# Patient Record
Sex: Female | Born: 1990 | Race: Black or African American | Hispanic: No | Marital: Single | Smoking: Never smoker
Health system: Southern US, Community
[De-identification: ages and names within clinical notes are randomized; demographics above are authoritative.]

## PROBLEM LIST (undated history)

## (undated) DIAGNOSIS — Z8719 Personal history of other diseases of the digestive system: Secondary | ICD-10-CM

## (undated) DIAGNOSIS — J45909 Unspecified asthma, uncomplicated: Secondary | ICD-10-CM

## (undated) DIAGNOSIS — Z8711 Personal history of peptic ulcer disease: Secondary | ICD-10-CM

## (undated) HISTORY — PX: TONSILLECTOMY: SUR1361

---
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
## 2012-08-14 ENCOUNTER — Encounter (HOSPITAL_BASED_OUTPATIENT_CLINIC_OR_DEPARTMENT_OTHER): Payer: Self-pay | Admitting: *Deleted

## 2012-08-14 ENCOUNTER — Emergency Department (HOSPITAL_BASED_OUTPATIENT_CLINIC_OR_DEPARTMENT_OTHER)
Admission: EM | Admit: 2012-08-14 | Discharge: 2012-08-15 | Disposition: A | Discharge status: Home / Self Care | Payer: Medicaid Other | Attending: Emergency Medicine | Admitting: Emergency Medicine

## 2012-08-14 DIAGNOSIS — R0982 Postnasal drip: Secondary | ICD-10-CM | POA: Insufficient documentation

## 2012-08-14 DIAGNOSIS — J45909 Unspecified asthma, uncomplicated: Secondary | ICD-10-CM | POA: Insufficient documentation

## 2012-08-14 DIAGNOSIS — R05 Cough: Secondary | ICD-10-CM | POA: Insufficient documentation

## 2012-08-14 DIAGNOSIS — J309 Allergic rhinitis, unspecified: Secondary | ICD-10-CM | POA: Insufficient documentation

## 2012-08-14 DIAGNOSIS — Z9089 Acquired absence of other organs: Secondary | ICD-10-CM | POA: Insufficient documentation

## 2012-08-14 DIAGNOSIS — J069 Acute upper respiratory infection, unspecified: Secondary | ICD-10-CM | POA: Insufficient documentation

## 2012-08-14 DIAGNOSIS — H9209 Otalgia, unspecified ear: Secondary | ICD-10-CM | POA: Insufficient documentation

## 2012-08-14 DIAGNOSIS — R059 Cough, unspecified: Secondary | ICD-10-CM | POA: Insufficient documentation

## 2012-08-14 HISTORY — DX: Unspecified asthma, uncomplicated: J45.909

## 2012-08-14 NOTE — ED Notes (Signed)
Pt c/o URI symptoms x 3 days 

## 2012-08-15 ENCOUNTER — Emergency Department (HOSPITAL_BASED_OUTPATIENT_CLINIC_OR_DEPARTMENT_OTHER): Payer: Medicaid Other

## 2012-08-15 MED ORDER — GUAIFENESIN 100 MG/5ML PO LIQD: 100.0000 mg | ORAL | Status: DC | PRN

## 2012-08-15 MED ORDER — FLUTICASONE PROPIONATE 50 MCG/ACT NA SUSP: 2.0000 | Freq: Every day | NASAL | Status: DC

## 2012-08-15 MED ORDER — IBUPROFEN 600 MG PO TABS: 600.0000 mg | ORAL_TABLET | Freq: Four times a day (QID) | ORAL | Status: DC | PRN

## 2012-08-15 NOTE — ED Provider Notes (Signed)
History     CSN: [id number]  Arrival date & time 08/14/12  2211   First MD Initiated Contact with Patient 08/15/12 0009      Chief Complaint  Patient presents with  . URI    (Consider location/radiation/quality/duration/timing/severity/associated sxs/prior treatment) Patient is a 22 y.o. female presenting with URI. The history is provided by the patient. No language interpreter was used.  URI Presenting symptoms: congestion, cough, ear pain and rhinorrhea   Presenting symptoms: no fever   Congestion:    Location:  Nasal   Interferes with eating/drinking: no   Cough:    Cough characteristics:  Non-productive   Severity:  Moderate   Onset quality:  Gradual   Duration:  3 days   Timing:  Intermittent   Progression:  Unchanged   Chronicity:  New Rhinorrhea:    Quality:  Green   Severity:  Moderate   Duration:  3 days   Timing:  Intermittent   Progression:  Unchanged Severity:  Moderate Onset quality:  Gradual Duration:  3 days Timing:  Intermittent Progression:  Unchanged Chronicity:  New Relieved by:  Nothing Worsened by:  Nothing tried Ineffective treatments:  None tried Associated symptoms: no neck pain and no swollen glands   Risk factors: not elderly     Past Medical History  Diagnosis Date  . Asthma     Past Surgical History  Procedure Laterality Date  . Tonsillectomy      History reviewed. No pertinent family history.  History  Substance Use Topics  . Smoking status: Never Smoker   . Smokeless tobacco: Not on file  . Alcohol Use: No    OB History   Grav Para Term Preterm Abortions TAB SAB Ect Mult Living                  Review of Systems  Constitutional: Negative for fever.  HENT: Positive for ear pain, congestion and rhinorrhea. Negative for neck pain.   Respiratory: Positive for cough.   All other systems reviewed and are negative.    Allergies  Review of patient's allergies indicates no known allergies.  Home Medications  No  current outpatient prescriptions on file.  BP 123/78  Temp(Src) 98.9 F (37.2 C) (Oral)  Resp 16  Ht 5\' 3"  (1.6 m)  Wt 226 lb (102.513 kg)  BMI 40.04 kg/m2  SpO2 99%  Physical Exam  Constitutional: She is oriented to person, place, and time. She appears well-developed and well-nourished. No distress.  HENT:  Head: Normocephalic and atraumatic.  Mouth/Throat: Oropharynx is clear and moist.  Clear colorless post nasal drainage  Eyes: Conjunctivae are normal. Pupils are equal, round, and reactive to light.  Neck: Normal range of motion. Neck supple.  Cardiovascular: Normal rate, regular rhythm and intact distal pulses.   Pulmonary/Chest: Effort normal and breath sounds normal. No stridor. She has no wheezes. She has no rales.  Abdominal: Soft. Bowel sounds are normal. There is no tenderness. There is no rebound and no guarding.  Musculoskeletal: Normal range of motion.  Lymphadenopathy:    She has no cervical adenopathy.  Neurological: She is alert and oriented to person, place, and time.  Skin: Skin is warm and dry.  Psychiatric: She has a normal mood and affect.    ED Course  Procedures (including critical care time)  Labs Reviewed - No data to display Dg Chest 2 View  08/15/2012   *RADIOLOGY REPORT*  Clinical Data: upper respiratory tract infection  CHEST - 2 VIEW  Comparison: None  Findings: The heart size and mediastinal contours are within normal limits.  Both lungs are clear.  The visualized skeletal structures are unremarkable.  IMPRESSION: Negative exam.   Original Report Authenticated By: Signa Kell, M.D.     No diagnosis found.    MDM  Allergic rhinitis post nasal drip will treat for same        Kamika Goodloe Smitty Cords, MD 08/15/12 [phone]

## 2012-08-15 NOTE — ED Notes (Signed)
MD at bedside. 

## 2016-04-04 ENCOUNTER — Encounter (HOSPITAL_COMMUNITY): Payer: Self-pay | Admitting: Nurse Practitioner

## 2016-04-04 ENCOUNTER — Emergency Department (HOSPITAL_COMMUNITY)
Admission: EM | Admit: 2016-04-04 | Discharge: 2016-04-04 | Disposition: A | Discharge status: Home / Self Care | Payer: Medicaid Other | Attending: Emergency Medicine | Admitting: Emergency Medicine

## 2016-04-04 DIAGNOSIS — K047 Periapical abscess without sinus: Secondary | ICD-10-CM | POA: Insufficient documentation

## 2016-04-04 DIAGNOSIS — Y929 Unspecified place or not applicable: Secondary | ICD-10-CM | POA: Insufficient documentation

## 2016-04-04 DIAGNOSIS — J45909 Unspecified asthma, uncomplicated: Secondary | ICD-10-CM | POA: Insufficient documentation

## 2016-04-04 DIAGNOSIS — Y9389 Activity, other specified: Secondary | ICD-10-CM | POA: Insufficient documentation

## 2016-04-04 DIAGNOSIS — X58XXXA Exposure to other specified factors, initial encounter: Secondary | ICD-10-CM | POA: Insufficient documentation

## 2016-04-04 DIAGNOSIS — Z79899 Other long term (current) drug therapy: Secondary | ICD-10-CM | POA: Insufficient documentation

## 2016-04-04 DIAGNOSIS — S025XXA Fracture of tooth (traumatic), initial encounter for closed fracture: Secondary | ICD-10-CM | POA: Insufficient documentation

## 2016-04-04 DIAGNOSIS — Y999 Unspecified external cause status: Secondary | ICD-10-CM | POA: Insufficient documentation

## 2016-04-04 MED ORDER — NAPROXEN 500 MG PO TABS: 500.0000 mg | ORAL_TABLET | Freq: Two times a day (BID) | ORAL | 0 refills | Status: DC

## 2016-04-04 MED ORDER — AMOXICILLIN 500 MG PO CAPS: 500.0000 mg | ORAL_CAPSULE | Freq: Three times a day (TID) | ORAL | 0 refills | Status: DC

## 2016-04-04 NOTE — ED Provider Notes (Signed)
[facility]-EMERGENCY DEPT Provider Note   CSN: [id number] Arrival date & time: 04/04/16  1828  By signing my name below, I, Rosario AdieWilliam Andrew Hiatt, attest that this documentation has been prepared under the direction and in the presence of [facility] Neese, [place].  Electronically Signed: Rosario AdieWilliam Andrew Hiatt, ED Scribe. 04/04/16. 6:49 PM.  History   Chief Complaint Chief Complaint  Patient presents with  . Dental Pain   The history is provided by the patient. No language interpreter was used.  Dental Pain   This is a new problem. The current episode started yesterday. The problem occurs constantly. The problem has been gradually worsening. The pain is at a severity of 7/10. Treatments tried: Ibuprofen. The treatment provided no relief.    HPI Comments: Susan SpatesCarrie Petitti is a 26 y.o. female with a h/o asthma, who presents to the Emergency Department complaining of persistent, lower, left-sided dental pain beginning yesterday, worsening since this morning. Pt describes her pain as throbbing. Pt notes that last week she was eating a hard food, and she fractured a tooth to her area of pain, but was not in pain at that time. Her pain to the area is exacerbated with exposure to cold air. Pt has been taking Ibuprofen at home without relief of her pain. She is not currently followed by a dental specialist. Pt denies fever, nausea, vomiting, abdominal pain, neck pain, ear pain, or any other associated symptoms.   Past Medical History:  Diagnosis Date  . Asthma    There are no active problems to display for this patient.  Past Surgical History:  Procedure Laterality Date  . TONSILLECTOMY     OB History    No data available     Home Medications    Prior to Admission medications   Medication Sig Start Date End Date Taking? Authorizing Provider  amoxicillin (AMOXIL) 500 MG capsule Take 1 capsule (500 mg total) by mouth 3 (three) times daily. 04/04/16   Hope Orlene OchM Neese, NP  fluticasone (FLONASE) 50 MCG/ACT nasal  spray Place 2 sprays into the nose daily. 08/15/12   April Palumbo, MD  guaiFENesin (ROBITUSSIN) 100 MG/5ML liquid Take 5-10 mLs (100-200 mg total) by mouth every 4 (four) hours as needed for cough. 08/15/12   April Palumbo, MD  ibuprofen (ADVIL,MOTRIN) 600 MG tablet Take 1 tablet (600 mg total) by mouth every 6 (six) hours as needed for pain. 08/15/12   April Palumbo, MD  naproxen (NAPROSYN) 500 MG tablet Take 1 tablet (500 mg total) by mouth 2 (two) times daily. 04/04/16   Hope Orlene OchM Neese, NP   Family History History reviewed. No pertinent family history.  Social History Social History  Substance Use Topics  . Smoking status: Never Smoker  . Smokeless tobacco: Never Used  . Alcohol use Yes   Allergies   Patient has no known allergies.  Review of Systems Review of Systems  Constitutional: Negative for fever.  HENT: Positive for dental problem. Negative for ear pain.   Gastrointestinal: Negative for abdominal pain, nausea and vomiting.  Musculoskeletal: Negative for neck pain.  Hematological: Positive for adenopathy.  All other systems reviewed and are negative.  Physical Exam Updated Vital Signs BP 125/97   Pulse 68   Temp 98.5 F (36.9 C) (Oral)   Resp 17   SpO2 100%   Physical Exam  Constitutional: She appears well-developed and well-nourished. No distress.  HENT:  Head: Normocephalic and atraumatic.  Right Ear: Tympanic membrane and external ear normal.  Left Ear: Tympanic  membrane and external ear normal.  Nose: Nose normal.  Mouth/Throat: Uvula is midline, oropharynx is clear and moist and mucous membranes are normal. No trismus in the jaw. No uvula swelling. No oropharyngeal exudate, posterior oropharyngeal edema, posterior oropharyngeal erythema or tonsillar abscesses. No tonsillar exudate.  Lower, frontal left tooth is a fractured with gingival erythema and swelling to the surrounding area.   Eyes: Conjunctivae and EOM are normal. Pupils are equal, round, and reactive to  light. No scleral icterus.  Neck: Normal range of motion.  Cardiovascular: Normal rate, regular rhythm and normal heart sounds.   No murmur heard. Pulmonary/Chest: Effort normal and breath sounds normal. No respiratory distress. She has no wheezes. She has no rales.  Abdominal: Soft. Bowel sounds are normal. She exhibits no distension. There is no tenderness.  Musculoskeletal: Normal range of motion.  Lymphadenopathy:    She has cervical adenopathy (anterior).  Neurological: She is alert.  Skin: No pallor.  Psychiatric: She has a normal mood and affect. Her behavior is normal.  Nursing note and vitals reviewed.  ED Treatments / Results  DIAGNOSTIC STUDIES: Oxygen Saturation is 100% on RA, normal by my interpretation.   COORDINATION OF CARE: 6:49 PM-Discussed next steps with pt. Pt verbalized understanding and is agreeable with the plan.   Labs (all labs ordered are listed, but only abnormal results are displayed) Labs Reviewed - No data to display  Radiology No results found.  Procedures Procedures   Medications Ordered in ED Medications - No data to display  Initial Impression / Assessment and Plan / ED Course  I have reviewed the triage vital signs and the nursing notes.  Clinical Course    Dental pain associated with dental fracture no signs or symptoms of dental abscess with patient afebrile, non toxic appearing and swallowing secretions well. Exam unconcerning for Ludwig's angina or other deep tissue infection in neck. As there is gum swelling and erythema, will treat with antibiotic and pain medicine. Urged patient to follow-up with dentist. I gave patient referral to dentist and stressed the importance of dental follow up for ultimate management of dental pain. Patient voices understanding and is agreeable to plan.  Final Clinical Impressions(s) / ED Diagnoses   Final diagnoses:  Closed fracture of tooth, initial encounter  Dental infection   New  Prescriptions New Prescriptions   AMOXICILLIN (AMOXIL) 500 MG CAPSULE    Take 1 capsule (500 mg total) by mouth 3 (three) times daily.   NAPROXEN (NAPROSYN) 500 MG TABLET    Take 1 tablet (500 mg total) by mouth 2 (two) times daily.   I personally performed the services described in this documentation, which was scribed in my presence. The recorded information has been reviewed and is accurate.     Susan Harbor, NP 04/04/16 1855    Susan Spates, MD 04/06/16 [phone]

## 2016-04-04 NOTE — ED Triage Notes (Signed)
Pt presents with c/o dental pain. The pain began last night. The pain is in the tooth that she chipped while eating last week. She took tylenol with some relief of the pain. She is also requesting a work note because she missed work today from the pain

## 2016-09-26 ENCOUNTER — Emergency Department (HOSPITAL_COMMUNITY)
Admission: EM | Admit: 2016-09-26 | Discharge: 2016-09-26 | Disposition: A | Discharge status: Home / Self Care | Payer: Self-pay | Attending: Emergency Medicine | Admitting: Emergency Medicine

## 2016-09-26 ENCOUNTER — Encounter (HOSPITAL_COMMUNITY): Payer: Self-pay | Admitting: *Deleted

## 2016-09-26 ENCOUNTER — Emergency Department (HOSPITAL_COMMUNITY): Payer: Self-pay

## 2016-09-26 DIAGNOSIS — Z79899 Other long term (current) drug therapy: Secondary | ICD-10-CM | POA: Insufficient documentation

## 2016-09-26 DIAGNOSIS — R197 Diarrhea, unspecified: Secondary | ICD-10-CM | POA: Insufficient documentation

## 2016-09-26 DIAGNOSIS — R112 Nausea with vomiting, unspecified: Secondary | ICD-10-CM | POA: Insufficient documentation

## 2016-09-26 DIAGNOSIS — J45909 Unspecified asthma, uncomplicated: Secondary | ICD-10-CM | POA: Insufficient documentation

## 2016-09-26 DIAGNOSIS — R1013 Epigastric pain: Secondary | ICD-10-CM | POA: Insufficient documentation

## 2016-09-26 LAB — COMPREHENSIVE METABOLIC PANEL
ALK PHOS: 45 U/L (ref 38–126)
ALT: 13 U/L — AB (ref 14–54)
ANION GAP: 8 (ref 5–15)
AST: 24 U/L (ref 15–41)
Albumin: 4.3 g/dL (ref 3.5–5.0)
BUN: 11 mg/dL (ref 6–20)
CALCIUM: 9 mg/dL (ref 8.9–10.3)
CO2: 22 mmol/L (ref 22–32)
CREATININE: 0.58 mg/dL (ref 0.44–1.00)
Chloride: 107 mmol/L (ref 101–111)
GFR calc non Af Amer: >60 mL/min (ref >60)
Glucose, Bld: 91 mg/dL (ref 65–99)
Potassium: 4 mmol/L (ref 3.5–5.1)
Sodium: 137 mmol/L (ref 135–145)
TOTAL PROTEIN: 7.1 g/dL (ref 6.5–8.1)
Total Bilirubin: 0.5 mg/dL (ref 0.3–1.2)

## 2016-09-26 LAB — URINALYSIS, ROUTINE W REFLEX MICROSCOPIC
BILIRUBIN URINE: NEGATIVE
Glucose, UA: NEGATIVE mg/dL
HGB URINE DIPSTICK: NEGATIVE
KETONES UR: NEGATIVE mg/dL
Leukocytes, UA: NEGATIVE
NITRITE: NEGATIVE
Protein, ur: NEGATIVE mg/dL
SPECIFIC GRAVITY, URINE: 1.008 (ref 1.005–1.030)
pH: 5 (ref 5.0–8.0)

## 2016-09-26 LAB — CBC WITH DIFFERENTIAL/PLATELET
BASOS ABS: 0 10*3/uL (ref 0.0–0.1)
BASOS PCT: 0 %
EOS ABS: 0.1 10*3/uL (ref 0.0–0.7)
Eosinophils Relative: 2 %
HEMATOCRIT: 34.6 % — AB (ref 36.0–46.0)
Hemoglobin: 12.3 g/dL (ref 12.0–15.0)
Lymphocytes Relative: 48 %
Lymphs Abs: 1.6 10*3/uL (ref 0.7–4.0)
MCH: 29.3 pg (ref 26.0–34.0)
MCHC: 35.5 g/dL (ref 30.0–36.0)
MCV: 82.4 fL (ref 78.0–100.0)
MONO ABS: 0.4 10*3/uL (ref 0.1–1.0)
MONOS PCT: 12 %
NEUTROS ABS: 1.3 10*3/uL — AB (ref 1.7–7.7)
NEUTROS PCT: 38 %
Platelets: 283 10*3/uL (ref 150–400)
RBC: 4.2 MIL/uL (ref 3.87–5.11)
RDW: 13.3 % (ref 11.5–15.5)
WBC: 3.4 10*3/uL — ABNORMAL LOW (ref 4.0–10.5)

## 2016-09-26 LAB — POC URINE PREG, ED: PREG TEST UR: NEGATIVE

## 2016-09-26 LAB — LIPASE, BLOOD: LIPASE: 27 U/L (ref 11–51)

## 2016-09-26 MED ORDER — PROMETHAZINE HCL 25 MG/ML IJ SOLN
12.5000 mg | Freq: Once | INTRAMUSCULAR | Status: AC
  Administered 2016-09-26: 12.5 mg via INTRAVENOUS
  Filled 2016-09-26: qty 1

## 2016-09-26 MED ORDER — SODIUM CHLORIDE 0.9 % IV BOLUS (SEPSIS)
1000.0000 mL | Freq: Once | INTRAVENOUS | Status: AC
  Administered 2016-09-26: 1000 mL via INTRAVENOUS

## 2016-09-26 MED ORDER — SUCRALFATE 1 GM/10ML PO SUSP: 1.0000 g | Freq: Three times a day (TID) | ORAL | 0 refills | Status: DC

## 2016-09-26 MED ORDER — KETOROLAC TROMETHAMINE 30 MG/ML IJ SOLN
15.0000 mg | Freq: Once | INTRAMUSCULAR | Status: AC
  Administered 2016-09-26: 15 mg via INTRAVENOUS
  Filled 2016-09-26: qty 1

## 2016-09-26 MED ORDER — DICYCLOMINE HCL 10 MG/ML IM SOLN
20.0000 mg | Freq: Once | INTRAMUSCULAR | Status: AC
  Administered 2016-09-26: 20 mg via INTRAMUSCULAR
  Filled 2016-09-26: qty 2

## 2016-09-26 MED ORDER — ONDANSETRON HCL 4 MG/2ML IJ SOLN
4.0000 mg | Freq: Once | INTRAMUSCULAR | Status: AC
  Administered 2016-09-26: 4 mg via INTRAVENOUS
  Filled 2016-09-26: qty 2

## 2016-09-26 MED ORDER — GI COCKTAIL ~~LOC~~
30.0000 mL | Freq: Once | ORAL | Status: AC
  Administered 2016-09-26: 30 mL via ORAL
  Filled 2016-09-26: qty 30

## 2016-09-26 MED ORDER — ONDANSETRON 8 MG PO TBDP: 8.0000 mg | ORAL_TABLET | Freq: Three times a day (TID) | ORAL | 0 refills | Status: DC | PRN

## 2016-09-26 MED ORDER — OMEPRAZOLE 20 MG PO CPDR: 20.0000 mg | DELAYED_RELEASE_CAPSULE | Freq: Every day | ORAL | 0 refills | Status: DC

## 2016-09-26 MED ORDER — DICYCLOMINE HCL 20 MG PO TABS: 20.0000 mg | ORAL_TABLET | Freq: Two times a day (BID) | ORAL | 0 refills | Status: DC

## 2016-09-26 NOTE — ED Notes (Signed)
US at bedside

## 2016-09-26 NOTE — ED Triage Notes (Signed)
Patient is alert and oriented x4.  She is complaining of abdominal pain with nausea, vomiting and diarrhea that started Tuesday.  Patient states that on Monday night she went out drinking and the vomiting started on Tuesday.  Currently she rates her pain 8 of 10

## 2016-09-26 NOTE — ED Provider Notes (Signed)
[facility]-EMERGENCY DEPT Provider Note   CSN: [id number] Arrival date & time: 09/26/16  [id number]     History   Chief Complaint Chief Complaint  Patient presents with  . Emesis    HPI Susan Lynch is a 26 y.o. female.  HPI 26 year old female with no significant past medical history presents to the ER today with complaints of nausea, headache, diarrhea, epigastric abdominal pain. Patient states that she drank a significant amount of alcohol approximately 4 days ago. States that on Monday she had a significant hangover where she had one episode of vomiting. Went to work and felt okay after that. He states on Tuesday she started developing profuse watery diarrhea and nonbloody nonbilious emesis. States that her by mouth intake is intermittent. She is able to keep some things down both thinks he throws up. She also complains of epigastric abdominal pain. States that her diarrhea has persisted. She was concerned since her symptoms are not improving. She does have a history of gastritis and viral enteritis. States that this felt like symptoms were due to food poisoning initially. She denies any melena or hematochezia. Denies any abdominal surgeries. Last bowel movement was prior to arrival. She denies recent antibiotic use, recent travel outside of the [place], new foods. She denies any fevers, headaches, vision changes, lightheadedness, dizziness, chest pain, shortness breath, urinary symptoms, vaginal symptoms, paresthesias. Patient has not tried any further symptoms at home. Makes better or worse.  Past Medical History:  Diagnosis Date  . Asthma     There are no active problems to display for this patient.   Past Surgical History:  Procedure Laterality Date  . TONSILLECTOMY      OB History    No data available       Home Medications    Prior to Admission medications   Medication Sig Start Date End Date Taking? Authorizing Provider  amoxicillin (AMOXIL) 500 MG capsule Take 1 capsule (500 mg  total) by mouth 3 (three) times daily. 04/04/16   Janne NapoleonNeese, Hope M, NP  fluticasone (FLONASE) 50 MCG/ACT nasal spray Place 2 sprays into the nose daily. 08/15/12   Palumbo, April, MD  guaiFENesin (ROBITUSSIN) 100 MG/5ML liquid Take 5-10 mLs (100-200 mg total) by mouth every 4 (four) hours as needed for cough. 08/15/12   Palumbo, April, MD  ibuprofen (ADVIL,MOTRIN) 600 MG tablet Take 1 tablet (600 mg total) by mouth every 6 (six) hours as needed for pain. 08/15/12   Palumbo, April, MD  naproxen (NAPROSYN) 500 MG tablet Take 1 tablet (500 mg total) by mouth 2 (two) times daily. 04/04/16   Janne NapoleonNeese, Hope M, NP    Family History No family history on file.  Social History Social History  Substance Use Topics  . Smoking status: Never Smoker  . Smokeless tobacco: Never Used  . Alcohol use Yes     Allergies   Patient has no known allergies.   Review of Systems Review of Systems  Constitutional: Negative for chills and fever.  HENT: Negative for congestion.   Eyes: Negative for visual disturbance.  Respiratory: Negative for cough and shortness of breath.   Cardiovascular: Negative for chest pain.  Gastrointestinal: Positive for abdominal pain (epigastric), diarrhea, nausea and vomiting. Negative for blood in stool.  Genitourinary: Negative for dysuria, flank pain, frequency, hematuria, urgency, vaginal bleeding and vaginal discharge.  Musculoskeletal: Negative for arthralgias and myalgias.  Skin: Negative for rash.  Neurological: Negative for dizziness, syncope, weakness, light-headedness, numbness and headaches.  Psychiatric/Behavioral: Negative for sleep disturbance. The  patient is not nervous/anxious.      Physical Exam Updated Vital Signs BP 127/73   Pulse 63   Temp 98.3 F (36.8 C) (Oral)   Resp 16   Ht 5\' 2"  (1.575 m)   Wt 81.6 kg (180 lb)   LMP 09/08/2016   SpO2 100%   BMI 32.92 kg/m   Physical Exam  Constitutional: She is oriented to person, place, and time. She appears  well-developed and well-nourished.  Non-toxic appearance. No distress.  HENT:  Head: Normocephalic and atraumatic.  Nose: Nose normal.  Mouth/Throat: Oropharynx is clear and moist.  Mucous membranes appear moist.  Eyes: Conjunctivae and EOM are normal. Pupils are equal, round, and reactive to light. Right eye exhibits no discharge. Left eye exhibits no discharge.  Neck: Normal range of motion. Neck supple.  Cardiovascular: Normal rate, regular rhythm, normal heart sounds and intact distal pulses.   Pulmonary/Chest: Effort normal and breath sounds normal. No respiratory distress. She exhibits no tenderness.  Abdominal: Soft. Bowel sounds are normal. There is tenderness in the right upper quadrant and epigastric area. There is no rigidity, no rebound, no guarding and no CVA tenderness.  Musculoskeletal: Normal range of motion. She exhibits no tenderness.  Lymphadenopathy:    She has no cervical adenopathy.  Neurological: She is alert and oriented to person, place, and time.  Skin: Skin is warm and dry. Capillary refill takes less than 2 seconds.  Good skin turgor.  Psychiatric: Her behavior is normal. Judgment and thought content normal.  Nursing note and vitals reviewed.    ED Treatments / Results  Labs (all labs ordered are listed, but only abnormal results are displayed) Labs Reviewed  COMPREHENSIVE METABOLIC PANEL - Abnormal; Notable for the following:       Result Value   ALT 13 (*)    All other components within normal limits  CBC WITH DIFFERENTIAL/PLATELET - Abnormal; Notable for the following:    WBC 3.4 (*)    HCT 34.6 (*)    Neutro Abs 1.3 (*)    All other components within normal limits  URINALYSIS, ROUTINE W REFLEX MICROSCOPIC - Abnormal; Notable for the following:    Color, Urine AMBER (*)    All other components within normal limits  LIPASE, BLOOD  POC URINE PREG, ED    EKG  EKG Interpretation None       Radiology No results  found.  Procedures Procedures (including critical care time)  Medications Ordered in ED Medications  sodium chloride 0.9 % bolus 1,000 mL (0 mLs Intravenous Stopped 09/26/16 1234)  ondansetron (ZOFRAN) injection 4 mg (4 mg Intravenous Given 09/26/16 1035)  gi cocktail (Maalox,Lidocaine,Donnatal) (30 mLs Oral Given 09/26/16 1034)  dicyclomine (BENTYL) injection 20 mg (20 mg Intramuscular Given 09/26/16 1035)  promethazine (PHENERGAN) injection 12.5 mg (12.5 mg Intravenous Given 09/26/16 1316)  ketorolac (TORADOL) 30 MG/ML injection 15 mg (15 mg Intravenous Given 09/26/16 1316)     Initial Impression / Assessment and Plan / ED Course  I have reviewed the triage vital signs and the nursing notes.  Pertinent labs & imaging results that were available during my care of the patient were reviewed by me and considered in my medical decision making (see chart for details).     Patient with symptoms consistent with viral gastroenteritis.  Vitals are stable, no fever.  No signs of dehydration, tolerating PO fluids > 6 oz.  Lungs are clear.  No focal abdominal pain, no concern for appendicitis, cholecystitis, pancreatitis, ruptured viscus,  UTI, kidney stone, or any other abdominal etiology.  Supportive therapy indicated with return if symptoms worsen.  Patient counseled.   Final Clinical Impressions(s) / ED Diagnoses   Final diagnoses:  Nausea vomiting and diarrhea    New Prescriptions Discharge Medication List as of 09/26/2016 12:41 PM    START taking these medications   Details  dicyclomine (BENTYL) 20 MG tablet Take 1 tablet (20 mg total) by mouth 2 (two) times daily., Starting Thu 09/26/2016, Print    omeprazole (PRILOSEC) 20 MG capsule Take 1 capsule (20 mg total) by mouth daily., Starting Thu 09/26/2016, Print    ondansetron (ZOFRAN-ODT) 8 MG disintegrating tablet Take 1 tablet (8 mg total) by mouth every 8 (eight) hours as needed for nausea., Starting Thu 09/26/2016, Print    sucralfate  (CARAFATE) 1 GM/10ML suspension Take 10 mLs (1 g total) by mouth 4 (four) times daily -  with meals and at bedtime., Starting Thu 09/26/2016, Print         Rise Mu, PA-C 09/30/16 1610    Little, Ambrose Finland, MD 09/30/16 1500

## 2016-09-26 NOTE — ED Notes (Signed)
Patient tolerating PO fluids 

## 2016-09-26 NOTE — Discharge Instructions (Signed)
Your abdominal pain is likely from gastritis, reflux or a stomach ulcer. You will need to take the prescribed proton pump inhibitor as directed, and avoid spicy/fatty/acidic foods. Avoid laying down flat within 30 minutes of eating. Avoid NSAIDs like ibuprofen or Aleve on an empty stomach. You may also take the bentyl for abd cramps. Take the Prilosec daily for acid reducer, and use Carafate before meals. Use zofran as needed for nausea. Follow up with the gastroenterologist (GI doctor) listed for ongoing evaluation of your abdominal pain. Return to the ER for new or worsening symptoms, any additional concers.   SEEK IMMEDIATE MEDICAL ATTENTION IF YOU DEVELOP ANY OF THE FOLLOWING SYMPTOMS: The pain does not go away or becomes severe.  A temperature above 101 develops.  Repeated vomiting occurs (multiple episodes).  Blood is being passed in stools or vomit (bright red or black tarry stools).  Return also if you develop chest pain, difficulty breathing, dizziness or fainting

## 2017-02-25 ENCOUNTER — Encounter (HOSPITAL_BASED_OUTPATIENT_CLINIC_OR_DEPARTMENT_OTHER): Payer: Self-pay

## 2017-02-25 ENCOUNTER — Emergency Department (HOSPITAL_BASED_OUTPATIENT_CLINIC_OR_DEPARTMENT_OTHER)
Admission: EM | Admit: 2017-02-25 | Discharge: 2017-02-26 | Disposition: A | Discharge status: Home / Self Care | Payer: Self-pay | Attending: Emergency Medicine | Admitting: Emergency Medicine

## 2017-02-25 ENCOUNTER — Other Ambulatory Visit: Payer: Self-pay

## 2017-02-25 DIAGNOSIS — J45909 Unspecified asthma, uncomplicated: Secondary | ICD-10-CM | POA: Insufficient documentation

## 2017-02-25 DIAGNOSIS — K2920 Alcoholic gastritis without bleeding: Secondary | ICD-10-CM | POA: Insufficient documentation

## 2017-02-25 HISTORY — DX: Personal history of other diseases of the digestive system: Z87.19

## 2017-02-25 HISTORY — DX: Personal history of peptic ulcer disease: Z87.11

## 2017-02-25 LAB — URINALYSIS, ROUTINE W REFLEX MICROSCOPIC
Bilirubin Urine: NEGATIVE
Glucose, UA: NEGATIVE mg/dL
Hgb urine dipstick: NEGATIVE
KETONES UR: NEGATIVE mg/dL
LEUKOCYTES UA: NEGATIVE
NITRITE: NEGATIVE
PH: 6 (ref 5.0–8.0)
Protein, ur: NEGATIVE mg/dL
SPECIFIC GRAVITY, URINE: 1.02 (ref 1.005–1.030)

## 2017-02-25 LAB — PREGNANCY, URINE: Preg Test, Ur: NEGATIVE

## 2017-02-25 NOTE — ED Triage Notes (Signed)
C/o abd pain, n/v started yesterday-states she LWBS [facility] ED just PTA-states they did draw blood work-NAD-steady gait

## 2017-02-26 MED ORDER — ONDANSETRON 8 MG PO TBDP: 8.0000 mg | ORAL_TABLET | Freq: Three times a day (TID) | ORAL | 1 refills | Status: AC | PRN

## 2017-02-26 MED ORDER — ONDANSETRON HCL 4 MG/2ML IJ SOLN
4.0000 mg | Freq: Once | INTRAMUSCULAR | Status: AC
  Administered 2017-02-26: 4 mg via INTRAVENOUS
  Filled 2017-02-26: qty 2

## 2017-02-26 MED ORDER — OMEPRAZOLE 20 MG PO CPDR: DELAYED_RELEASE_CAPSULE | ORAL | 0 refills | Status: AC

## 2017-02-26 MED ORDER — SUCRALFATE 1 GM/10ML PO SUSP
1.0000 g | Freq: Three times a day (TID) | ORAL | Status: DC
  Administered 2017-02-26: 1 g via ORAL
  Filled 2017-02-26: qty 10

## 2017-02-26 MED ORDER — SODIUM CHLORIDE 0.9 % IV BOLUS (SEPSIS)
1000.0000 mL | Freq: Once | INTRAVENOUS | Status: AC
  Administered 2017-02-26: 1000 mL via INTRAVENOUS

## 2017-02-26 MED ORDER — SUCRALFATE 1 G PO TABS: 1.0000 g | ORAL_TABLET | Freq: Three times a day (TID) | ORAL | 0 refills | Status: AC

## 2017-02-26 MED ORDER — PANTOPRAZOLE SODIUM 40 MG IV SOLR
40.0000 mg | Freq: Once | INTRAVENOUS | Status: AC
  Administered 2017-02-26: 40 mg via INTRAVENOUS
  Filled 2017-02-26: qty 40

## 2017-02-26 NOTE — ED Notes (Signed)
Pt discharged to home with family. NAD.  

## 2017-02-26 NOTE — ED Provider Notes (Signed)
[facility] Provider Note: Lowella DellJ. Lane Ara Mano, MD, FACEP  CSN: [id number] MRN: [id number] ARRIVAL: 02/25/17 at 2236 ROOM: [facility]01/[facility]   CHIEF COMPLAINT  Abdominal Pain   HISTORY OF PRESENT ILLNESS  02/26/17 12:11 AM Susan Lynch is a [age] y.o. female who was treated for gastritis in June of this year.  She states that episode was brought on by a bout of drinking.  She was treated with multiple medications including Carafate, a PPI, Bentyl and Zofran with improvement.  She is here with another similar episode that began 3 or 4 days ago, again after drinking.  She describes the pain as sharp and is located in the epigastrium.  She rates it as a 7 out of 10.  It has been associated with nausea and vomiting which persist.  She had some transient diarrhea that has resolved.  She states that any attempts to eat or drink cause her to vomit.  [facility] where lab work was done that was unremarkable, but she ultimately left without being seen.   Past Medical History:  Diagnosis Date  . Asthma   . History of stomach ulcers     Past Surgical History:  Procedure Laterality Date  . TONSILLECTOMY      No family history on file.  Social History   Tobacco Use  . Smoking status: Never Smoker  . Smokeless tobacco: Never Used  Substance Use Topics  . Alcohol use: Yes    Comment: occ  . Drug use: Yes    Types: Marijuana    Prior to Admission medications   Not on File    Allergies Apple and Cherry   REVIEW OF SYSTEMS  Negative except as noted here or in the History of Present Illness.   PHYSICAL EXAMINATION  Initial Vital Signs Blood pressure 128/75, pulse 68, temperature 98.4 F (36.9 C), temperature source Oral, resp. rate 18, last menstrual period 02/14/2017, SpO2 100 %.  Examination General: Well-developed, well-nourished female in no acute distress; appearance consistent with age of record HENT: normocephalic;  atraumatic Eyes: pupils equal, round and reactive to light; extraocular muscles intact Neck: supple Heart: regular rate and rhythm Lungs: clear to auscultation bilaterally Abdomen: soft; nondistended; epigastric tenderness; no masses or hepatosplenomegaly; bowel sounds hypoactive Extremities: No deformity; full range of motion; pulses normal Neurologic: Awake, alert and oriented; motor function intact in all extremities and symmetric; no facial droop Skin: Warm and dry Psychiatric: Normal mood and affect   RESULTS  Summary of this visit's results, reviewed by myself:   EKG Interpretation  Date/Time:    Ventricular Rate:    PR Interval:    QRS Duration:   QT Interval:    QTC Calculation:   R Axis:     Text Interpretation:        Laboratory Studies: Results for orders placed or performed during the hospital encounter of 02/25/17 (from the past 24 hour(s))  Pregnancy, urine     Status: None   Collection Time: 02/25/17 10:40 PM  Result Value Ref Range   Preg Test, Ur NEGATIVE NEGATIVE  Urinalysis, Routine w reflex microscopic     Status: Abnormal   Collection Time: 02/25/17 10:40 PM  Result Value Ref Range   Color, Urine YELLOW YELLOW   APPearance CLOUDY (A) CLEAR   Specific Gravity, Urine 1.020 1.005 - 1.030   pH 6.0 5.0 - 8.0   Glucose, UA NEGATIVE NEGATIVE mg/dL   Hgb urine dipstick NEGATIVE NEGATIVE  Bilirubin Urine NEGATIVE NEGATIVE   Ketones, ur NEGATIVE NEGATIVE mg/dL   Protein, ur NEGATIVE NEGATIVE mg/dL   Nitrite NEGATIVE NEGATIVE   Leukocytes, UA NEGATIVE NEGATIVE   Imaging Studies: No results found.  ED COURSE  Nursing notes and initial vitals signs, including pulse oximetry, reviewed.  Vitals:   02/26/17 0138  BP: 128/75  Pulse: 68  Resp: 18  Temp: 98.4 F (36.9 C)  TempSrc: Oral  SpO2: 100%   1:44 AM Pain improved after IV and oral medications.  We will treat for gastritis.  PROCEDURES    ED DIAGNOSES     ICD-10-CM   1. 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

## 2017-07-03 ENCOUNTER — Encounter (HOSPITAL_BASED_OUTPATIENT_CLINIC_OR_DEPARTMENT_OTHER): Payer: Self-pay | Admitting: *Deleted

## 2017-07-03 ENCOUNTER — Emergency Department (HOSPITAL_BASED_OUTPATIENT_CLINIC_OR_DEPARTMENT_OTHER)
Admission: EM | Admit: 2017-07-03 | Discharge: 2017-07-03 | Disposition: A | Discharge status: Home / Self Care | Payer: Self-pay | Attending: Emergency Medicine | Admitting: Emergency Medicine

## 2017-07-03 ENCOUNTER — Other Ambulatory Visit: Payer: Self-pay

## 2017-07-03 DIAGNOSIS — J069 Acute upper respiratory infection, unspecified: Secondary | ICD-10-CM | POA: Insufficient documentation

## 2017-07-03 DIAGNOSIS — J45909 Unspecified asthma, uncomplicated: Secondary | ICD-10-CM | POA: Insufficient documentation

## 2017-07-03 DIAGNOSIS — Z79899 Other long term (current) drug therapy: Secondary | ICD-10-CM | POA: Insufficient documentation

## 2017-07-03 DIAGNOSIS — B9789 Other viral agents as the cause of diseases classified elsewhere: Secondary | ICD-10-CM | POA: Insufficient documentation

## 2017-07-03 LAB — RAPID STREP SCREEN (MED CTR MEBANE ONLY): STREPTOCOCCUS, GROUP A SCREEN (DIRECT): NEGATIVE

## 2017-07-03 MED ORDER — BENZONATATE 100 MG PO CAPS: 100.0000 mg | ORAL_CAPSULE | Freq: Three times a day (TID) | ORAL | 0 refills | Status: AC | PRN

## 2017-07-03 MED ORDER — ACETAMINOPHEN ER 650 MG PO TBCR: 650.0000 mg | EXTENDED_RELEASE_TABLET | Freq: Three times a day (TID) | ORAL | 0 refills | Status: AC | PRN

## 2017-07-03 MED ORDER — FLUTICASONE PROPIONATE 50 MCG/ACT NA SUSP: 1.0000 | Freq: Every day | NASAL | 2 refills | Status: AC

## 2017-07-03 NOTE — Discharge Instructions (Signed)
You were seen in the emergency today and diagnosed with a viral illness. Your rapid strep test was negative, we will call you if the culture is positive. I have prescribed you multiple medications to treat your symptoms.   -Flonase to be used 1 spray in each nostril daily.  This medication is used to treat your congestion.  -Tessalon can be taken once every 8 hours as needed.  This medication is used to treat your cough.  -Tylenol to be taken once every 8 hours as needed for pain.   We have prescribed you new medication(s) today. Discuss the medications prescribed today with your pharmacist as they can have adverse effects and interactions with your other medicines including over the counter and prescribed medications. Seek medical evaluation if you start to experience new or abnormal symptoms after taking one of these medicines, seek care immediately if you start to experience difficulty breathing, feeling of your throat closing, facial swelling, or rash as these could be indications of a more serious allergic reaction   You will need to follow-up with your primary care provider in 5 days if your symptoms have not improved.  If you do not have a primary care provider one is provided in your discharge instructions.  Return to the emergency department for any new or worsening symptoms including but not limited to persistent fever (temp>100.4) not improved with Ibuprofen/tylenol, worsening difficulty breathing, chest pain, passing out, or any other concerns.

## 2017-07-03 NOTE — ED Provider Notes (Signed)
[facility] EMERGENCY DEPARTMENT Provider Note   CSN: [id number] Arrival date & time: 07/03/17  2126     History   Chief Complaint Chief Complaint  Patient presents with  . Sore Throat    HPI Susan Lynch is a 27 y.o. female with a hx of asthma and PUD who presents to the ED with URI sxs for the past 3 days. Patient states she is experiencing congestion, rhinorrhea, bilateral ear pain, sore throat, and productive cough with mucous sputum. States she has tried allergy medication without relief. No other specific alleviating/aggravating factors. Denies fever, chills, wheezing, dyspnea, or chest pain.   HPI  Past Medical History:  Diagnosis Date  . Asthma   . History of stomach ulcers     There are no active problems to display for this patient.   Past Surgical History:  Procedure Laterality Date  . TONSILLECTOMY       OB History   None      Home Medications    Prior to Admission medications   Medication Sig Start Date End Date Taking? Authorizing Provider  omeprazole (PRILOSEC) 20 MG capsule Take 1 capsule every morning at least 30 minutes before first dose of Carafate. 02/26/17   Molpus, John, MD  ondansetron (ZOFRAN ODT) 8 MG disintegrating tablet Take 1 tablet (8 mg total) by mouth every 8 (eight) hours as needed for nausea or vomiting. 02/26/17   Molpus, John, MD  sucralfate (CARAFATE) 1 g tablet Take 1 tablet (1 g total) by mouth 4 (four) times daily -  with meals and at bedtime. 02/26/17   Molpus, Jonny Ruiz, MD    Family History History reviewed. No pertinent family history.  Social History Social History   Tobacco Use  . Smoking status: Never Smoker  . Smokeless tobacco: Never Used  Substance Use Topics  . Alcohol use: Yes    Comment: occ  . Drug use: Yes    Types: Marijuana     Allergies   Apple and Cherry   Review of Systems Review of Systems  Constitutional: Negative for chills and fever.  HENT: Positive for congestion, ear pain,  rhinorrhea and sore throat.   Respiratory: Positive for cough. Negative for shortness of breath and wheezing.   Cardiovascular: Negative for chest pain.  Gastrointestinal: Negative for abdominal pain.     Physical Exam Updated Vital Signs BP 123/76   Pulse 65   Temp 99 F (37.2 C) (Oral)   Resp 16   Ht  (1.6 m)   Wt 79.4 kg (175 lb)   LMP 06/19/2017   SpO2 100%   BMI 31.00 kg/m   Physical Exam  Constitutional: She appears well-developed and well-nourished.  Non-toxic appearance. No distress.  HENT:  Head: Normocephalic and atraumatic.  Right Ear: Tympanic membrane is not perforated, not erythematous, not retracted and not bulging.  Left Ear: Tympanic membrane is not perforated, not erythematous, not retracted and not bulging.  Nose: Mucosal edema present. Right sinus exhibits no maxillary sinus tenderness and no frontal sinus tenderness. Left sinus exhibits no maxillary sinus tenderness and no frontal sinus tenderness.  Mouth/Throat: Uvula is midline and oropharynx is clear and moist. No oropharyngeal exudate or posterior oropharyngeal erythema.  Eyes: Pupils are equal, round, and reactive to light. Conjunctivae and EOM are normal. Right eye exhibits no discharge. Left eye exhibits no discharge.  Neck: Normal range of motion. Neck supple.  Cardiovascular: Normal rate and regular rhythm.  No murmur heard. Pulmonary/Chest: Effort normal and breath  sounds normal. No respiratory distress. She has no wheezes. She has no rhonchi. She has no rales.  Abdominal: Soft. She exhibits no distension. There is no tenderness.  Lymphadenopathy:    She has no cervical adenopathy.  Neurological: She is alert.  Skin: Skin is warm and dry. No rash noted.  Psychiatric: She has a normal mood and affect. Her behavior is normal.  Nursing note and vitals reviewed.   ED Treatments / Results  Labs Results for orders placed or performed during the hospital encounter of 07/03/17  Rapid strep  screen  Result Value Ref Range   Streptococcus, Group A Screen (Direct) NEGATIVE NEGATIVE   No results found. EKG None  Radiology No results found.  Procedures Procedures (including critical care time)  Medications Ordered in ED Medications - No data to display   Initial Impression / Assessment and Plan / ED Course  I have reviewed the triage vital signs and the nursing notes.  Pertinent labs & imaging results that were available during my care of the patient were reviewed by me and considered in my medical decision making (see chart for details).    Patient presents with symptoms consistent with likely viral URI with cough. She is nontoxic appearing, in no apparent distress, vitals WNL. Patient is afebrile and without adventitious sounds on lung exam, doubt PNA at this time. No signs of respiratory distress, no wheezing, do not suspect current asthma exacerbation. Afebrile, no sinus tenderness, sxs onset < 7 days ago, doubt acute bacterila sinusitis. Rapid strep ordered per triage negative, culture pending. Suspect viral etiology at this time, will treat supportively with Tylenol, Flonase, and Tessalon. I discussed results, treatment plan, need for PCP follow-up, and return precautions with the patient. Provided opportunity for questions, patient confirmed understanding and is in agreement with plan.    Final Clinical Impressions(s) / ED Diagnoses   Final diagnoses:  Viral URI with cough    ED Discharge Orders        Ordered    fluticasone (FLONASE) 50 MCG/ACT nasal spray  Daily     07/03/17 2244    benzonatate (TESSALON) 100 MG capsule  3 times daily PRN     07/03/17 2244    acetaminophen (TYLENOL 8 HOUR) 650 MG CR tablet  Every 8 hours PRN     07/03/17 2244       Kalin Kyler, Pleas Koch, PA-C 07/03/17 2332    Gwyneth Sprout, MD 07/05/17 0825

## 2017-07-03 NOTE — ED Triage Notes (Signed)
Sore throat x 3 days

## 2017-07-06 LAB — CULTURE, GROUP A STREP (THRC)

## 2018-06-28 ENCOUNTER — Emergency Department (HOSPITAL_BASED_OUTPATIENT_CLINIC_OR_DEPARTMENT_OTHER): Payer: 59

## 2018-06-28 ENCOUNTER — Other Ambulatory Visit: Payer: Self-pay

## 2018-06-28 ENCOUNTER — Emergency Department (HOSPITAL_BASED_OUTPATIENT_CLINIC_OR_DEPARTMENT_OTHER)
Admission: EM | Admit: 2018-06-28 | Discharge: 2018-06-28 | Disposition: A | Discharge status: Home / Self Care | Payer: 59 | Attending: Emergency Medicine | Admitting: Emergency Medicine

## 2018-06-28 ENCOUNTER — Encounter (HOSPITAL_BASED_OUTPATIENT_CLINIC_OR_DEPARTMENT_OTHER): Payer: Self-pay | Admitting: Emergency Medicine

## 2018-06-28 DIAGNOSIS — J45909 Unspecified asthma, uncomplicated: Secondary | ICD-10-CM | POA: Insufficient documentation

## 2018-06-28 DIAGNOSIS — B349 Viral infection, unspecified: Secondary | ICD-10-CM | POA: Insufficient documentation

## 2018-06-28 DIAGNOSIS — F1210 Cannabis abuse, uncomplicated: Secondary | ICD-10-CM | POA: Diagnosis not present

## 2018-06-28 DIAGNOSIS — R0602 Shortness of breath: Secondary | ICD-10-CM | POA: Diagnosis not present

## 2018-06-28 DIAGNOSIS — R070 Pain in throat: Secondary | ICD-10-CM | POA: Insufficient documentation

## 2018-06-28 DIAGNOSIS — R0981 Nasal congestion: Secondary | ICD-10-CM | POA: Insufficient documentation

## 2018-06-28 DIAGNOSIS — R05 Cough: Secondary | ICD-10-CM | POA: Diagnosis present

## 2018-06-28 NOTE — ED Provider Notes (Signed)
[facility] Emergency Department Provider Note MRN:  [id number]  Arrival date & time: 06/28/18     Chief Complaint   Cough   History of Present Illness   Susan Lynch is a 28 y.o. year-old female with a history of asthma presenting to the ED with chief complaint of cough.  Patient endorsing several days of sore throat, nasal congestion, cough, chest pain that radiates from her sore throat, shortness of breath.  Concerned about coronavirus.  Patient denies fever, no abdominal pain, no dysuria.  Symptoms are constant, moderate, no exacerbating relieving factors.  Review of Systems  A complete 10 system review of systems was obtained and all systems are negative except as noted in the HPI and PMH.   Patient's Health History    Past Medical History:  Diagnosis Date  . Asthma   . History of stomach ulcers     Past Surgical History:  Procedure Laterality Date  . TONSILLECTOMY      No family history on file.  Social History   Socioeconomic History  . Marital status: Single    Spouse name: Not on file  . Number of children: Not on file  . Years of education: Not on file  . Highest education level: Not on file  Occupational History  . Not on file  Social Needs  . Financial resource strain: Not on file  . Food insecurity:    Worry: Not on file    Inability: Not on file  . Transportation needs:    Medical: Not on file    Non-medical: Not on file  Tobacco Use  . Smoking status: Never Smoker  . Smokeless tobacco: Never Used  Substance and Sexual Activity  . Alcohol use: Yes    Comment: occ  . Drug use: Yes    Types: Marijuana  . Sexual activity: Not on file  Lifestyle  . Physical activity:    Days per week: Not on file    Minutes per session: Not on file  . Stress: Not on file  Relationships  . Social connections:    Talks on phone: Not on file    Gets together: Not on file    Attends religious service: Not on file    Active member of  club or organization: Not on file    Attends meetings of clubs or organizations: Not on file    Relationship status: Not on file  . Intimate partner violence:    Fear of current or ex partner: Not on file    Emotionally abused: Not on file    Physically abused: Not on file    Forced sexual activity: Not on file  Other Topics Concern  . Not on file  Social History Narrative  . Not on file     Physical Exam  Vital Signs and Nursing Notes reviewed Vitals:   06/28/18 1851  BP: 127/74  Pulse: 71  Resp: 20  Temp: 98.5 F (36.9 C)  SpO2: 100%    CONSTITUTIONAL: Well-appearing, NAD NEURO:  Alert and oriented x 3, no focal deficits EYES:  eyes equal and reactive ENT/NECK:  no LAD, no JVD CARDIO: Regular rate, well-perfused, normal S1 and S2 PULM:  CTAB no wheezing or rhonchi GI/GU:  normal bowel sounds, non-distended, non-tender MSK/SPINE:  No gross deformities, no edema SKIN:  no rash, atraumatic PSYCH:  Appropriate speech and behavior  Diagnostic and Interventional Summary    EKG Interpretation  Date/Time:  Sunday June 28 2018 19:13:40 EDT  Ventricular Rate:  59 PR Interval:    QRS Duration: 88 QT Interval:  408 QTC Calculation: 405 R Axis:   49 Text Interpretation:  Sinus rhythm Baseline wander in lead(s) V3 Confirmed by Kennis Carina [phone]) on 06/28/2018 7:27:38 PM      Labs Reviewed - No data to display  DG Chest 2 View  Final Result      Medications - No data to display   Procedures Critical Care  ED Course and Medical Decision Making  I have reviewed the triage vital signs and the nursing notes.  Pertinent labs & imaging results that were available during my care of the patient were reviewed by me and considered in my medical decision making (see below for details).  Patient is with no increased work of breathing, normal vital signs, lungs clear.  PERC negative.  EKG without acute concerns, chest x-ray is completely clear.  Coronavirus possible, but per  our protocols no indication for testing at this time.  Patient is appropriate for discharge with home isolation instructions.  After the discussed management above, the patient was determined to be safe for discharge.  The patient was in agreement with this plan and all questions regarding their care were answered.  ED return precautions were discussed and the patient will return to the ED with any significant worsening of condition.  Elmer Sow. Pilar Plate, MD [facility] Emergency Medicine [facility] [email or web address]  Final Clinical Impressions(s) / ED Diagnoses     ICD-10-CM   1. Viral illness B34.9     ED Discharge Orders    None         Sabas Sous, MD 06/28/18 2018

## 2018-06-28 NOTE — ED Notes (Signed)
ED Provider at bedside. 

## 2018-06-28 NOTE — ED Triage Notes (Signed)
Pt c/o cough x 2 days. Hx of asthma.

## 2018-06-28 NOTE — Discharge Instructions (Signed)
You were evaluated in the Emergency Department and after careful evaluation, we did not find any emergent condition requiring admission or further testing in the hospital.  Your symptoms today seem to be due to a viral illness.  Your exam and testing today is very reassuring.  Coronavirus is a possibility, so we recommend home isolation for the next 2 weeks.  Please return to the Emergency Department if you experience any worsening of your condition.  We encourage you to follow up with a primary care provider.  Thank you for allowing [place] to be a part of your care.

## 2018-06-28 NOTE — ED Notes (Addendum)
Pt reporting cough, sore throat, URI symptoms, ShOB, chest pressure. Pt states the chest pressure is intermittent and is worse with cough, non-radiating. Denies fever. Pt traveled to [facility] this past week with family. Reports no sick contacts.

## 2018-07-12 ENCOUNTER — Emergency Department (HOSPITAL_BASED_OUTPATIENT_CLINIC_OR_DEPARTMENT_OTHER)
Admission: EM | Admit: 2018-07-12 | Discharge: 2018-07-12 | Disposition: A | Discharge status: Home / Self Care | Payer: 59 | Attending: Emergency Medicine | Admitting: Emergency Medicine

## 2018-07-12 ENCOUNTER — Other Ambulatory Visit: Payer: Self-pay

## 2018-07-12 DIAGNOSIS — R059 Cough, unspecified: Secondary | ICD-10-CM

## 2018-07-12 DIAGNOSIS — J45909 Unspecified asthma, uncomplicated: Secondary | ICD-10-CM | POA: Insufficient documentation

## 2018-07-12 DIAGNOSIS — R05 Cough: Secondary | ICD-10-CM | POA: Diagnosis not present

## 2018-07-12 NOTE — ED Triage Notes (Signed)
Pt is here for a work release note. Was self quarantined for 2 weeks. States she need to be re-evaled to return to work. States she feels better. Has a dry hacky cough. Does have asthma and had cough prior to URI

## 2018-07-12 NOTE — ED Provider Notes (Signed)
Emergency Department Provider Note   I have reviewed the triage vital signs and the nursing notes.   HISTORY  Chief Complaint Follow-up   HPI Susan Lynch is a 28 y.o. female with PMH of asthma presents to the emergency department for evaluation of cough.  Patient was evaluated for flulike symptoms 2 weeks ago and held out of work and kept on home quarantine.  Patient states that she is feeling much better and feels ready to return to work.  She has not had fevers, chills.  She continues to have a mild dry cough which is occasional.  No shortness of breath.  No chest pain.  No sick contacts.  Patient states that she needs a note to return to work but feels ready to do so.  Past Medical History:  Diagnosis Date  . Asthma   . History of stomach ulcers     There are no active problems to display for this patient.   Past Surgical History:  Procedure Laterality Date  . TONSILLECTOMY      Allergies Apple and Cherry  No family history on file.  Social History Social History   Tobacco Use  . Smoking status: Never Smoker  . Smokeless tobacco: Never Used  Substance Use Topics  . Alcohol use: Yes    Comment: occ  . Drug use: Yes    Types: Marijuana    Review of Systems  Constitutional: No fever/chills Eyes: No visual changes. ENT: No sore throat. Cardiovascular: Denies chest pain. Respiratory: Denies shortness of breath. Positive cough.  Gastrointestinal: No abdominal pain.  No nausea, no vomiting.  No diarrhea.  No constipation. Genitourinary: Negative for dysuria. Musculoskeletal: Negative for back pain. Skin: Negative for rash. Neurological: Negative for headaches, focal weakness or numbness.  10-point ROS otherwise negative.  ____________________________________________   PHYSICAL EXAM:  VITAL SIGNS: ED Triage Vitals [07/12/18 2132]  Enc Vitals Group     BP 125/84     Pulse Rate 75     Resp 18     Temp 99.2 F (37.3 C)     Temp src      SpO2  100 %     Weight 159 lb 13.3 oz (72.5 kg)     Height 5\' 2"  (1.575 m)   Constitutional: Alert and oriented. Well appearing and in no acute distress. Eyes: Conjunctivae are normal.  Head: Atraumatic. Nose: No congestion/rhinnorhea. Mouth/Throat: Mucous membranes are moist.  Neck: No stridor.  Cardiovascular: Normal rate, regular rhythm. Good peripheral circulation. Grossly normal heart sounds.   Respiratory: Normal respiratory effort.  No retractions. Lungs CTAB. Gastrointestinal: No distention.  Musculoskeletal: No lower extremity tenderness nor edema. No gross deformities of extremities. Neurologic:  Normal speech and language. Skin:  Skin is warm, dry and intact. No rash noted.  ____________________________________________  RADIOLOGY  None  ____________________________________________   PROCEDURES  Procedure(s) performed:   Procedures  None  ____________________________________________   INITIAL IMPRESSION / ASSESSMENT AND PLAN / ED COURSE  Pertinent labs & imaging results that were available during my care of the patient were reviewed by me and considered in my medical decision making (see chart for details).   Patient presents to the emergency department for evaluation of cough.  Her symptoms have significantly improved since her ED evaluation 2 weeks ago.  She was vies to self quarantine for 2 weeks in the setting of the COVID pandemic.  She is feeling well and ready to return.  Afebrile here with normal oxygen saturation.  No  increased work of breathing.  Advised that she continue to wear a facemask when out in public or at work.  Patient wearing a facemask here in the ED which she can take home with her.  Provided a note saying she is cleared to return to work.  Discussed ED return precautions and symptoms that would require that she take additional time off work.    ____________________________________________  FINAL CLINICAL IMPRESSION(S) / ED DIAGNOSES  Final  diagnoses:  Cough    Note:  This document was prepared using Dragon voice recognition software and may include unintentional dictation errors.  Alona Bene, MD Emergency Medicine    Long, Arlyss Repress, MD 07/12/18 2141

## 2018-07-12 NOTE — Discharge Instructions (Signed)
Your symptoms are improving. You are clear to return to work on Tuesday without restriction. Please wear a mask while out in public as recommended by the CDC. Return to the ED with any new or worsening symptoms.

## 2019-08-19 IMAGING — CR CHEST - 2 VIEW
2 series · 2 of 2 positions shown · non-contrast
Comparison: Chest radiograph dated 12/02/2017

CLINICAL DATA: 28-year-old female with shortness of breath.

EXAM:
CHEST - 2 VIEW

[w chest pa]
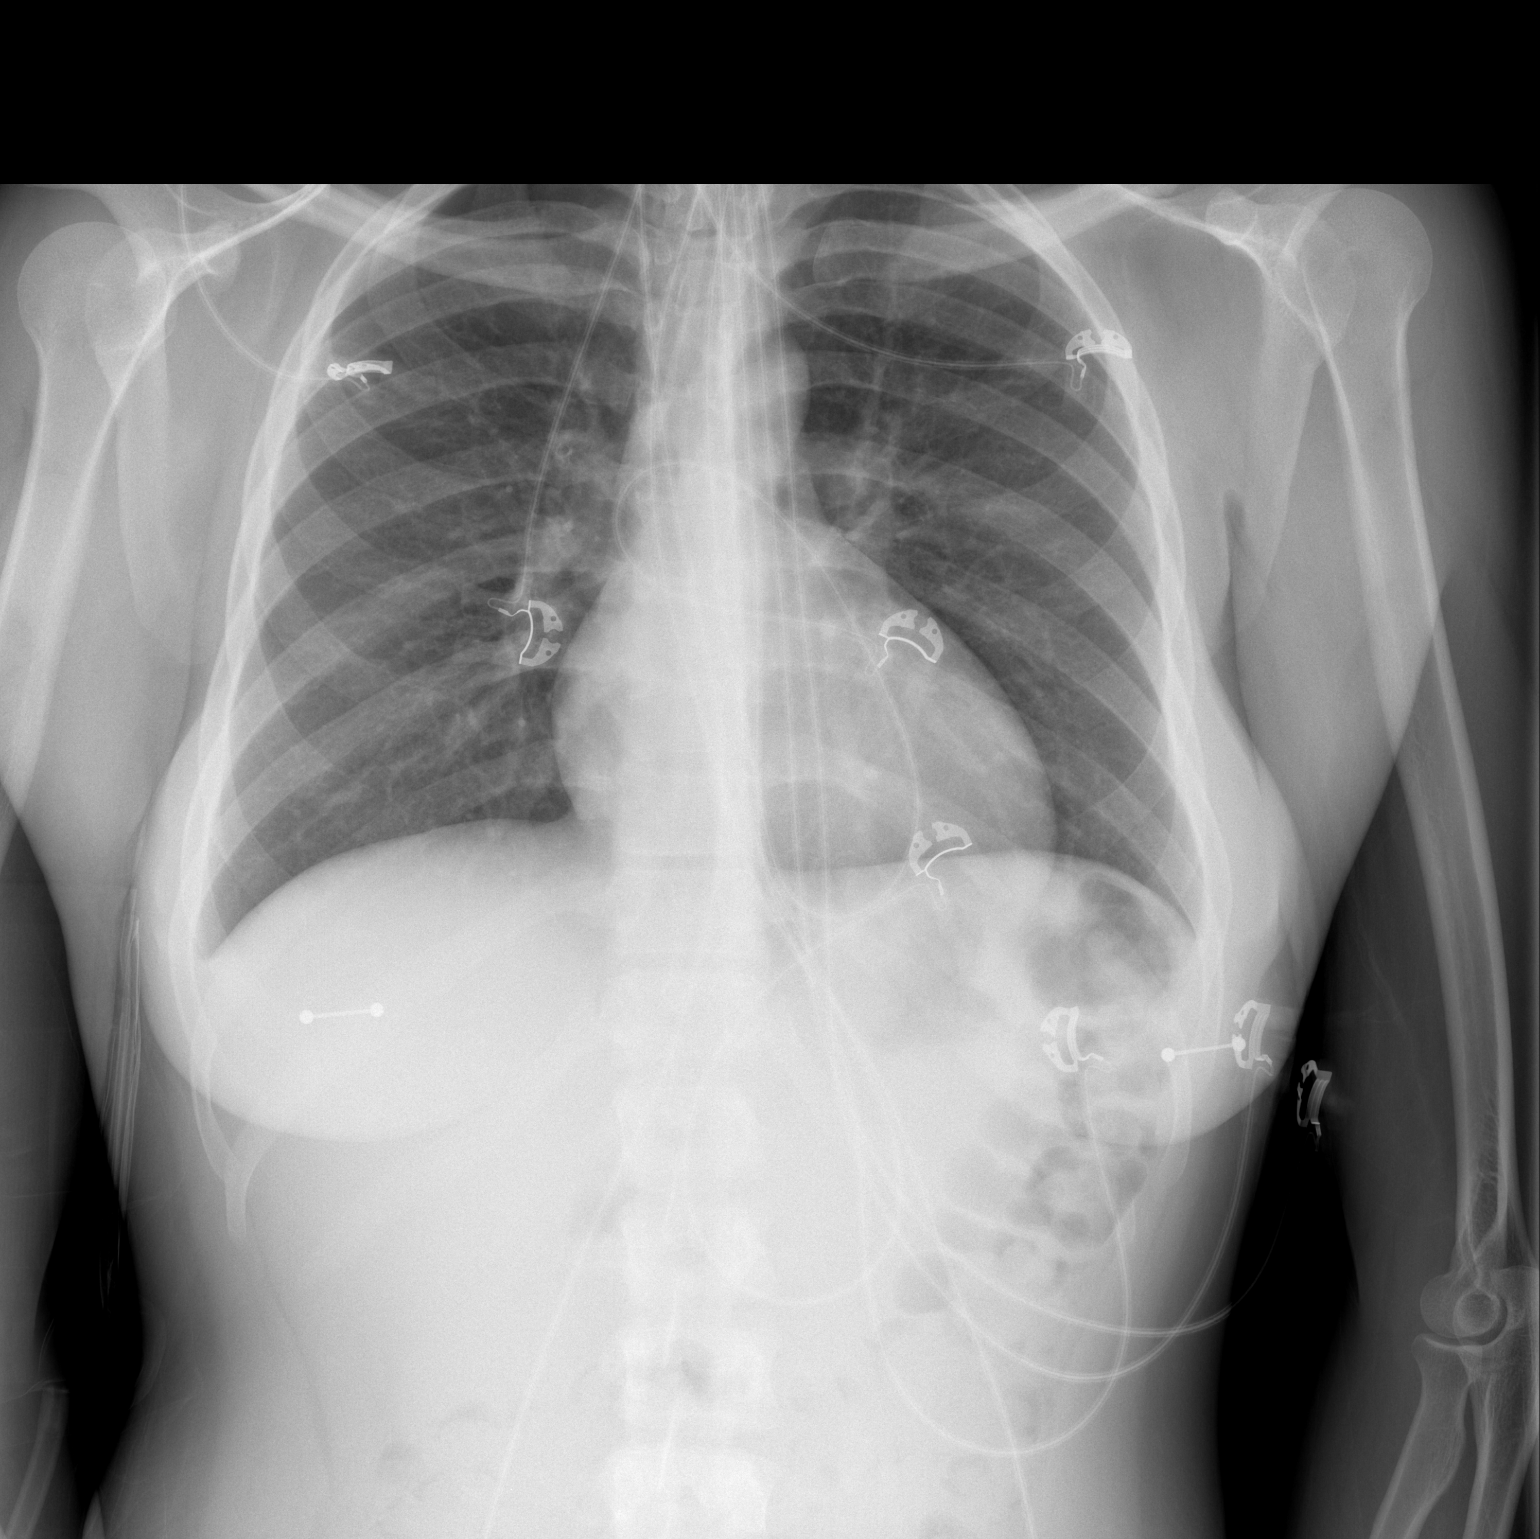

[w chest lat]
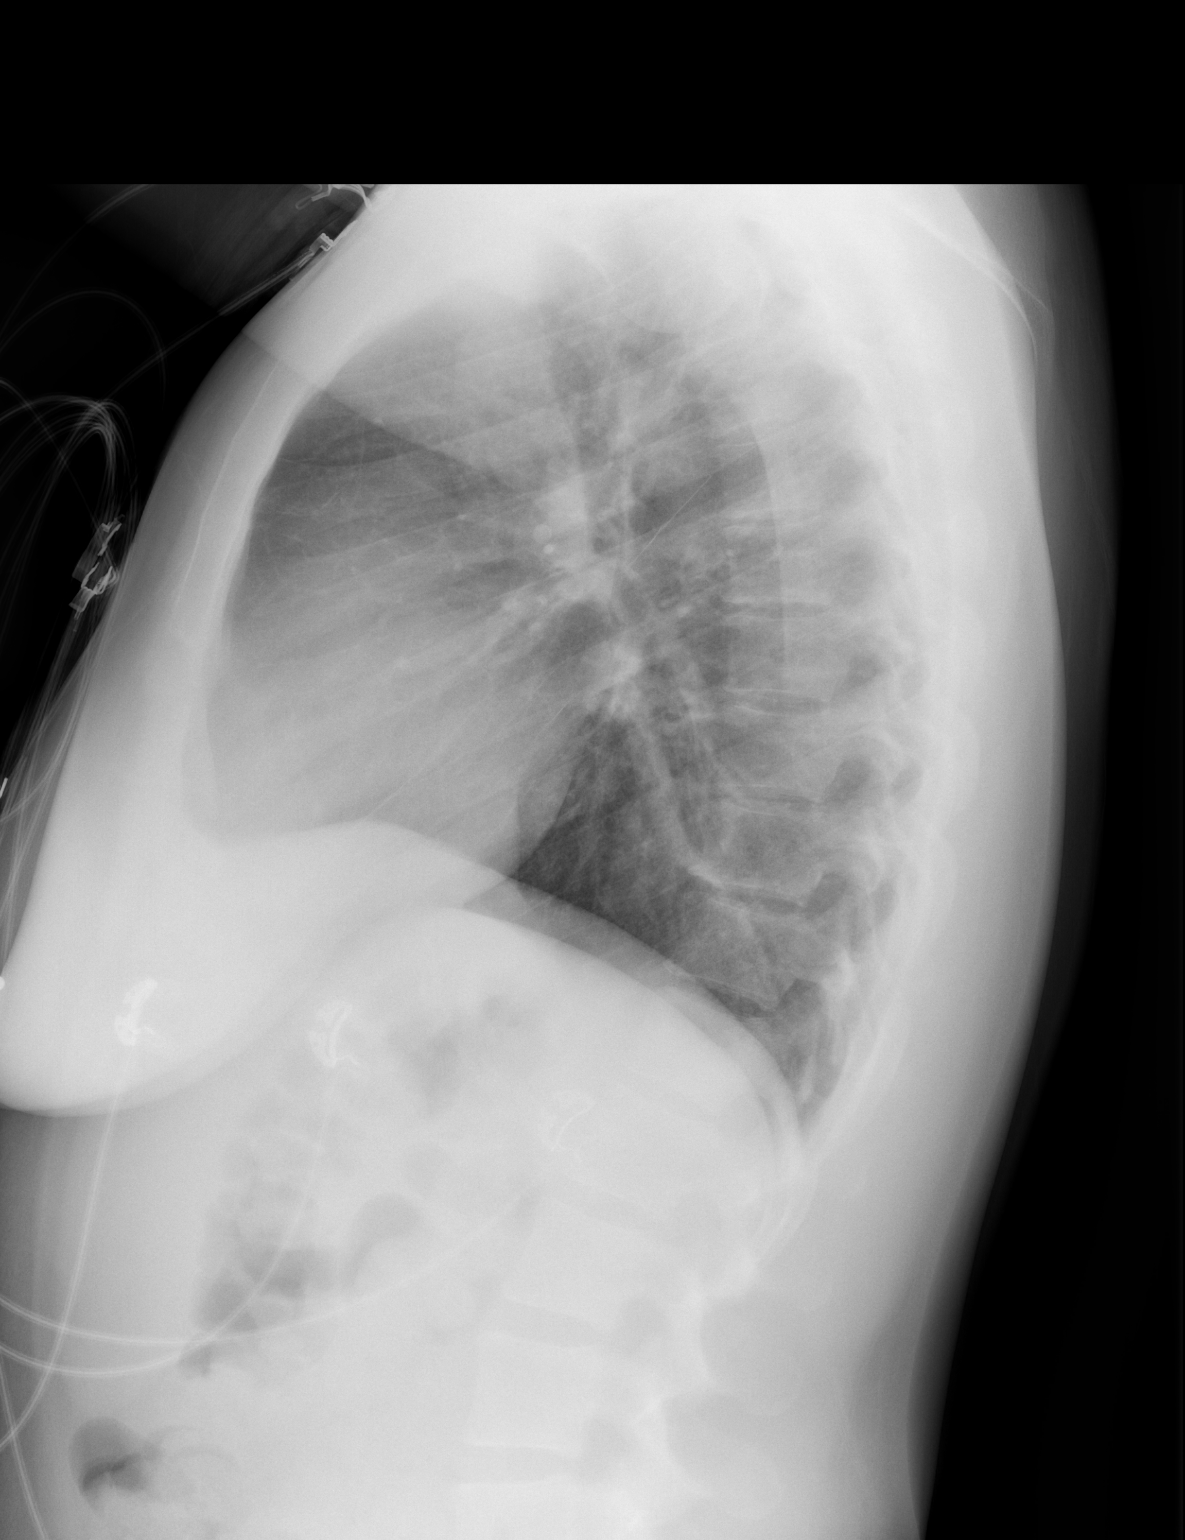

[2 of 2 positions shown; findings below may reference images not displayed]

FINDINGS: The heart size and mediastinal contours are within normal limits.
Both lungs are clear. The visualized skeletal structures are
unremarkable.
IMPRESSION: No active cardiopulmonary disease.

## 2022-04-01 HISTORY — PX: WISDOM TOOTH EXTRACTION: SHX21

## 2023-02-06 ENCOUNTER — Ambulatory Visit (INDEPENDENT_AMBULATORY_CARE_PROVIDER_SITE_OTHER): Payer: PRIVATE HEALTH INSURANCE | Admitting: Obstetrics and Gynecology

## 2023-02-06 ENCOUNTER — Encounter: Payer: Self-pay | Admitting: Obstetrics and Gynecology

## 2023-02-06 ENCOUNTER — Other Ambulatory Visit (HOSPITAL_COMMUNITY)
Admission: RE | Admit: 2023-02-06 | Discharge: 2023-02-06 | Disposition: A | Discharge status: Home / Self Care | Payer: Self-pay | Source: Ambulatory Visit | Attending: Obstetrics and Gynecology | Admitting: Obstetrics and Gynecology

## 2023-02-06 VITALS — BP 126/90 | HR 93 | Ht 64.0 in | Wt 264.7 lb

## 2023-02-06 DIAGNOSIS — Z01419 Encounter for gynecological examination (general) (routine) without abnormal findings: Secondary | ICD-10-CM | POA: Insufficient documentation

## 2023-02-06 NOTE — Progress Notes (Signed)
New GYN- currently [facility] inmate Reports regular cycles  LMP 02/03/23 Pt states that last intercourse was 4 years ago Reports last pap 2018

## 2023-02-06 NOTE — Progress Notes (Signed)
GYNECOLOGY ANNUAL PREVENTATIVE CARE ENCOUNTER NOTE  History:     Susan Lynch is a 32 y.o. G0P0000 female here for a routine annual gynecologic exam.  Current complaints: none.   Denies abnormal vaginal bleeding, discharge, pelvic pain, problems with intercourse or other gynecologic concerns. Pt notes regular menses lasting 5-6 days   Gynecologic History Patient's last menstrual period was 02/03/2023. Contraception: abstinence Last Pap: 2018. Last mammogram: n/a.   Obstetric History OB History  Gravida Para Term Preterm AB Living  0 0 0 0 0 0  SAB IAB Ectopic Multiple Live Births  0 0 0 0 0    Past Medical History:  Diagnosis Date   Asthma    History of stomach ulcers     Past Surgical History:  Procedure Laterality Date   TONSILLECTOMY  2012   WISDOM TOOTH EXTRACTION  04/2022    Current Outpatient Medications on File Prior to Visit  Medication Sig Dispense Refill   atorvastatin (LIPITOR) 10 MG tablet Take 10 mg by mouth daily.     busPIRone (BUSPAR) 30 MG tablet Take 30 mg by mouth 2 (two) times daily.     cholecalciferol (VITAMIN D3) 25 MCG (1000 UNIT) tablet Take 1,000 Units by mouth once a week.     citalopram (CELEXA) 10 MG tablet Take 10 mg by mouth daily.     acetaminophen (TYLENOL 8 HOUR) 650 MG CR tablet Take 1 tablet (650 mg total) by mouth every 8 (eight) hours as needed for pain. (Patient not taking: Reported on 02/06/2023) 30 tablet 0   benzonatate (TESSALON) 100 MG capsule Take 1 capsule (100 mg total) by mouth 3 (three) times daily as needed for cough. (Patient not taking: Reported on 02/06/2023) 30 capsule 0   fluticasone (FLONASE) 50 MCG/ACT nasal spray Place 1 spray into both nostrils daily. (Patient not taking: Reported on 02/06/2023) 16 g 2   omeprazole (PRILOSEC) 20 MG capsule Take 1 capsule every morning at least 30 minutes before first dose of Carafate. (Patient not taking: Reported on 02/06/2023) 30 capsule 0   ondansetron (ZOFRAN ODT) 8 MG  disintegrating tablet Take 1 tablet (8 mg total) by mouth every 8 (eight) hours as needed for nausea or vomiting. (Patient not taking: Reported on 02/06/2023) 10 tablet 1   sucralfate (CARAFATE) 1 g tablet Take 1 tablet (1 g total) by mouth 4 (four) times daily -  with meals and at bedtime. (Patient not taking: Reported on 02/06/2023) 40 tablet 0   No current facility-administered medications on file prior to visit.    Allergies  Allergen Reactions   Apple Juice Anaphylaxis and Hives   Cherry Anaphylaxis and Hives    Social History:  reports that she quit smoking about 4 years ago. Her smoking use included cigars and e-cigarettes. She started smoking about 7 years ago. She has never used smokeless tobacco. She reports current alcohol use. She reports current drug use. Drugs: Marijuana and MDMA (Ecstacy).  Family History  Problem Relation Age of Onset   Diabetes Mother    Hyperlipidemia Father    Kidney failure Father     The following portions of the patient's history were reviewed and updated as appropriate: allergies, current medications, past family history, past medical history, past social history, past surgical history and problem list.  Review of Systems Pertinent items noted in HPI and remainder of comprehensive ROS otherwise negative.  Physical Exam:  BP (!) 126/90   Pulse 93   Ht 5\' 4"  (1.626 m)  Wt 264 lb 11.2 oz (120.1 kg)   LMP 02/03/2023   BMI 45.44 kg/m  CONSTITUTIONAL: Well-developed, well-nourished female in no acute distress.  HENT:  Normocephalic, atraumatic, External right and left ear normal. Oropharynx is clear and moist EYES: Conjunctivae and EOM are normal.  NECK: Normal range of motion, supple, no masses.  Normal thyroid.  SKIN: Skin is warm and dry. No rash noted. Not diaphoretic. No erythema. No pallor. Small area of healed boils near axilla and in gluteal folds questionable for mild hidradenitis supporativa MUSCULOSKELETAL: Normal range of motion. No  tenderness.  No cyanosis, clubbing, or edema.  2+ distal pulses. NEUROLOGIC: Alert and oriented to person, place, and time. Normal reflexes, muscle tone coordination.  PSYCHIATRIC: Normal mood and affect. Normal behavior. Normal judgment and thought content. CARDIOVASCULAR: Normal heart rate noted, regular rhythm RESPIRATORY: Clear to auscultation bilaterally. Effort and breath sounds normal, no problems with respiration noted. BREASTS: Symmetric in size. No masses, tenderness, skin changes, nipple drainage, or lymphadenopathy bilaterally. Performed in the presence of a chaperone. ABDOMEN: Soft, no distention noted.  No tenderness, rebound or guarding.  PELVIC: Normal appearing external genitalia and urethral meatus; normal appearing vaginal mucosa and cervix.  No abnormal discharge noted.  Pap smear obtained.  Normal uterine size, no other palpable masses, no uterine or adnexal tenderness.  Performed in the presence of a chaperone.   Assessment and Plan:    1. Women's annual routine gynecological examination Normal annual exam - Cytology - PAP( [facility])  Will follow up results of pap smear and manage accordingly. Routine preventative health maintenance measures emphasized. Please refer to After Visit Summary for other counseling recommendations.     F/u in 1 year or PRN  Mariel Aloe, MD, FACOG Obstetrician & Gynecologist, [facility] for [employer], [facility]

## 2023-02-10 LAB — CYTOLOGY - PAP
Comment: NEGATIVE
Diagnosis: NEGATIVE
High risk HPV: NEGATIVE
